# Patient Record
Sex: Female | Born: 1985 | Race: White | Hispanic: No | Smoking: Former smoker
Health system: Southern US, Community
[De-identification: ages and names within clinical notes are randomized; demographics above are authoritative.]

## PROBLEM LIST (undated history)

## (undated) DIAGNOSIS — F603 Borderline personality disorder: Secondary | ICD-10-CM

## (undated) DIAGNOSIS — F319 Bipolar disorder, unspecified: Secondary | ICD-10-CM

## (undated) DIAGNOSIS — F419 Anxiety disorder, unspecified: Secondary | ICD-10-CM

## (undated) DIAGNOSIS — J45909 Unspecified asthma, uncomplicated: Secondary | ICD-10-CM

## (undated) HISTORY — DX: Unspecified asthma, uncomplicated: J45.909

## (undated) HISTORY — DX: Anxiety disorder, unspecified: F41.9

## (undated) HISTORY — DX: Bipolar disorder, unspecified: F31.9

## (undated) HISTORY — DX: Borderline personality disorder: F60.3

## (undated) HISTORY — PX: NO PAST SURGERIES: SHX2092

---

## 2009-09-13 ENCOUNTER — Emergency Department: Payer: Self-pay | Admitting: Emergency Medicine

## 2009-09-14 ENCOUNTER — Emergency Department: Payer: Self-pay | Admitting: Emergency Medicine

## 2009-09-22 ENCOUNTER — Inpatient Hospital Stay: Payer: Self-pay | Admitting: Psychiatry

## 2011-02-10 DIAGNOSIS — F4001 Agoraphobia with panic disorder: Secondary | ICD-10-CM | POA: Insufficient documentation

## 2013-06-26 DIAGNOSIS — J45909 Unspecified asthma, uncomplicated: Secondary | ICD-10-CM | POA: Insufficient documentation

## 2013-06-28 DIAGNOSIS — O26899 Other specified pregnancy related conditions, unspecified trimester: Secondary | ICD-10-CM

## 2013-06-28 DIAGNOSIS — Z6791 Unspecified blood type, Rh negative: Secondary | ICD-10-CM | POA: Insufficient documentation

## 2018-02-21 ENCOUNTER — Encounter: Payer: Self-pay | Admitting: Physician Assistant

## 2018-02-21 ENCOUNTER — Ambulatory Visit (INDEPENDENT_AMBULATORY_CARE_PROVIDER_SITE_OTHER): Payer: BLUE CROSS/BLUE SHIELD | Admitting: Physician Assistant

## 2018-02-21 ENCOUNTER — Ambulatory Visit (INDEPENDENT_AMBULATORY_CARE_PROVIDER_SITE_OTHER): Payer: BLUE CROSS/BLUE SHIELD

## 2018-02-21 ENCOUNTER — Ambulatory Visit: Payer: Self-pay | Admitting: Physician Assistant

## 2018-02-21 VITALS — BP 102/68 | HR 60 | Resp 14 | Ht 65.0 in | Wt 204.0 lb

## 2018-02-21 DIAGNOSIS — J4520 Mild intermittent asthma, uncomplicated: Secondary | ICD-10-CM

## 2018-02-21 DIAGNOSIS — Z7689 Persons encountering health services in other specified circumstances: Secondary | ICD-10-CM | POA: Diagnosis not present

## 2018-02-21 DIAGNOSIS — M79672 Pain in left foot: Secondary | ICD-10-CM | POA: Diagnosis not present

## 2018-02-21 DIAGNOSIS — M779 Enthesopathy, unspecified: Secondary | ICD-10-CM | POA: Diagnosis not present

## 2018-02-21 DIAGNOSIS — M778 Other enthesopathies, not elsewhere classified: Secondary | ICD-10-CM

## 2018-02-21 DIAGNOSIS — F603 Borderline personality disorder: Secondary | ICD-10-CM | POA: Insufficient documentation

## 2018-02-21 DIAGNOSIS — F319 Bipolar disorder, unspecified: Secondary | ICD-10-CM | POA: Insufficient documentation

## 2018-02-21 MED ORDER — ALBUTEROL SULFATE 108 (90 BASE) MCG/ACT IN AEPB: 1.0000 | INHALATION_SPRAY | RESPIRATORY_TRACT | 2 refills | Status: AC | PRN

## 2018-02-21 NOTE — Patient Instructions (Signed)
Foot and ankle tendonitis is a common cause of foot pain. It occurs when there is inflammation or irritation of the tendons, which is usually due to overuse  from repetitive movements or stretching or an injury such as an ankle sprain.  Extensor tendonitis causes pain across the top of the foot. This form of foot tendonitis is caused by inflammation or irritation of the tendons that pull the toes up, usually from repeated friction or compression from a poorly-fitting shoe.  Let's start by looking at how tendons work. Tendons are tough, fibrous tissues that connect muscles to bones.  The extensor tendons join the muscles on the front of the lower leg to the toes. They pull the toes upwards away from the ground.  The two main extensor tendons come from extensor hallucis longus, which lifts up the big toe, and extensor digitorum, which lifts the other four toes.  Both tendons run down across the front of the ankle, across the top of the foot and then fan out attaching to the tips of the toes.  The tendons sit between the skin and the bones and there is little padding around them, making them prone to injury resulting in pain in top of foot.  Common Causes Extensor tendonitis most commonly occurs due to the foot rubbing against a shoe. It tends to affect people who spend long periods on their feet, people walking or running on uneven surfaces or up and down hills and people who lace their shoes too tightly.  Calf tightness can also contribute to the condition as can altered foot biomechanics.  People with high foot arches are more likely to have pressure on the top of their foot, and people with flat feet find their extensor tendons under more strain, both of which increases the chance of developing tendonitis.  Extensor Tendonitis can also occur after an injury such as if you have dropped something heavy onto the top of your foot or kicking something.  Symptoms of Extensor Tendonitis The symptoms tend  to be confined to the top of the foot, occasionally spreading to the arch of the foot:  1)  Top of Foot Pain: Tends to be worse with activity and better with rest.  The top of the foot maybe tender to touch making it uncomfortable wearing shoes  2)  Swelling and/or Bruising: There may be some visible swelling and/or bruising across the top of the foot  A simple test for extensor tendonitis is to try and draw your toes up towards you while resisting the movement with your hand. If that recreates your pain on top of foot, you probably have the condition.  Treatment Options Treatment aims to reduce irritation, inflammation and pain in top of foot. There are a number of things that can help:  1)  Rest: It is really important to avoid aggravating activities for ample time to allow the tendon to heal.  If it hurts, stop!  2)  Ice: Using ice regularly helps reduce the pain and inflammation.  Visit the Ice Treatment section to find out how to safely and effectively use ice.  3)  Medication: Non-steroidal anti-inflammatories such as ibuprofen and naproxen are often used to reduce pain and inflammation  4)  Shoe Laces: The simplest way to treat and prevent extensor tendonitis is to change how you lace your shoes.  Either tie your knot at the side or miss out one of the lacing holes over the most painful area.  It sounds simple, but it  really does make a big difference.  5)  Exercises: Strengthening exercises for the extensor muscles help to improve strength and endurance.  Calf stretches can also help as having tight calves puts more strain on the extensor tendons  6)  Orthotics: Shoe insoles and inserts can be used to provide padding and support the foot, taking any undue tension off the tendons  7)  Physical Therapy: Ultrasound therapy can help promote healing  8)  Steroid Injections: If the pain fails to settle, a steroid injection can be given to help reduce the inflammation.  Care must be taken  as it does temporarily weaken the tendon  The general principle for treating foot and ankle tendonitis is to give the injury rest so the body can heal it. This takes time, usually weeks to months. Your doctor may give you a walking boot to keep your foot and ankle immobilized so you aren't using it, or you may be directed to have no weight bearing on the affected foot.

## 2018-02-21 NOTE — Progress Notes (Signed)
HPI:                                                                Laura Eaton is a 33 y.o. female who presents to [facility]: Primary Care Sports Medicine today to establish care  Current concerns: left foot pain  Reports walking at work one day and felt a "pop" on the top of her left foot followed by a sharp, stabbing pain approx 1 month ago.  Reports foot is intermittently swollen. Seems worse at the end of the day after wearing tennis shoes. Better on the weekends when she does not walk around as much. Pain is worse with dorsiflexion. Has tried ice as needed. Has not taken any anti-inflammatories. No prior injury or trauma.  Bipolar disorder: last hospitalization was 2013. Reports during this time her ex-husband had left her, which triggered this episode. Does not currently take any medications for mood or anxiety. She uses CBD oil daily.  Asthma: mild, intermittent. Uses albuterol inhaler when she has a cold/respiratory illness. No exacerbations in the last year. No nocturnal cough. Requesting refill  Depression screen [facility] 2/9 02/21/2018  Decreased Interest 1  Down, Depressed, Hopeless 1  PHQ - 2 Score 2  Altered sleeping 0  Tired, decreased energy 3  Change in appetite 3  Feeling bad or failure about yourself  1  Trouble concentrating 0  Moving slowly or fidgety/restless 0  Suicidal thoughts 0  PHQ-9 Score 9    GAD 7 : Generalized Anxiety Score 02/21/2018  Nervous, Anxious, on Edge 0  Control/stop worrying 0  Worry too much - different things 1  Trouble relaxing 1  Restless 0  Easily annoyed or irritable 1  Afraid - awful might happen 1  Total GAD 7 Score 4      Past Medical History:  Diagnosis Date  . Anxiety   . Asthma   . Bipolar disorder (HCC)   . Borderline personality disorder [facility])    Past Surgical History:  Procedure Laterality Date  . NO PAST SURGERIES     Social History   Tobacco Use  . Smoking status: Former Smoker     Types: Cigarettes    Last attempt to quit: 03/15/2013    Years since quitting: 4.9  . Smokeless tobacco: Never Used  Substance Use Topics  . Alcohol use: Not Currently   family history includes Heart attack in her maternal grandfather and paternal grandmother; Hypertension in her paternal grandmother; Prostate cancer in her paternal grandfather.    ROS: negative except as noted in the HPI  Medications: Current Outpatient Medications  Medication Sig Dispense Refill  . Albuterol Sulfate (PROAIR RESPICLICK) 108 (90 Base) MCG/ACT AEPB Inhale 1-2 puffs into the lungs every 4 (four) hours as needed. 1 each 2   No current facility-administered medications for this visit.    No Known Allergies     Objective:  BP 102/68   Pulse 60   Resp 14   Ht  (1.651 m)   Wt 204 lb (92.5 kg)   LMP 02/14/2018 (Exact Date)   SpO2 98%   BMI 33.95 kg/m  Gen:  alert, not ill-appearing, no distress, appropriate for age, obese female HEENT: head normocephalic without obvious abnormality, conjunctiva and cornea clear,  trachea midline Pulm: Normal work of breathing, normal phonation, clear to auscultation bilaterally, no wheezes, rales or rhonchi CV: Normal rate, regular rhythm, s1 and s2 distinct, no murmurs, clicks or rubs  Neuro: alert and oriented x 3, no tremor MSK: extremities atraumatic, normal gait and station Left foot: atraumatic, distal pulses intact, tenderness of the proximal midfoot overlying the transverse tarsal joint and navicular Skin: intact, no rashes on exposed skin, no jaundice, no cyanosis Psych: well-groomed, cooperative, good eye contact, euthymic mood, affect mood-congruent, speech is articulate, and thought processes clear and goal-directed    No results found for this or any previous visit (from the past 72 hour(s)). No results found.    Assessment and Plan: 33 y.o. female with  .[name] was seen today for establish care.  Diagnoses and all orders for this  visit:  Encounter to establish care  Acute pain of left foot -     DG Foot Complete Left  Mild intermittent asthma without complication -     Albuterol Sulfate (PROAIR RESPICLICK) 108 (90 Base) MCG/ACT AEPB; Inhale 1-2 puffs into the lungs every 4 (four) hours as needed.  Extensor tendonitis of foot   - Personally reviewed PMH, PSH, PFH, medications, allergies, HM - Age-appropriate cancer screening: overdue for Pap smear, encouraged to schedule in 1 month - Influenza declined  - Tdap UTD - PHQ2=2  Acute pain of left foot - suspect extensor tendonitis - x-ray pending - home rehab exercises provided, perform daily - follow-up in 1 month  Bipolar disorder - PHQ9=9, no acute safety issues - GAD7=4 - declines treatment  Mild intermittent asthma - pulse ox 98% on RA at rest, no adventitious lung sounds - cont Albuterol 1-2 puffs Q4H prn  Patient education and anticipatory guidance given Patient agrees with treatment plan Follow-up in 1 month as needed if symptoms worsen or fail to improve  Levonne Hubertharley E. Lori-Ann Lindfors PA-C

## 2018-03-21 ENCOUNTER — Other Ambulatory Visit (HOSPITAL_COMMUNITY)
Admission: RE | Admit: 2018-03-21 | Discharge: 2018-03-21 | Disposition: A | Discharge status: Home / Self Care | Payer: BLUE CROSS/BLUE SHIELD | Source: Ambulatory Visit | Attending: Physician Assistant | Admitting: Physician Assistant

## 2018-03-21 ENCOUNTER — Ambulatory Visit (INDEPENDENT_AMBULATORY_CARE_PROVIDER_SITE_OTHER): Payer: BLUE CROSS/BLUE SHIELD | Admitting: Physician Assistant

## 2018-03-21 ENCOUNTER — Encounter: Payer: Self-pay | Admitting: Physician Assistant

## 2018-03-21 VITALS — BP 113/78 | HR 80 | Wt 203.0 lb

## 2018-03-21 DIAGNOSIS — E6609 Other obesity due to excess calories: Secondary | ICD-10-CM | POA: Diagnosis not present

## 2018-03-21 DIAGNOSIS — Z131 Encounter for screening for diabetes mellitus: Secondary | ICD-10-CM

## 2018-03-21 DIAGNOSIS — Z124 Encounter for screening for malignant neoplasm of cervix: Secondary | ICD-10-CM | POA: Insufficient documentation

## 2018-03-21 DIAGNOSIS — Z13220 Encounter for screening for lipoid disorders: Secondary | ICD-10-CM

## 2018-03-21 DIAGNOSIS — Z6833 Body mass index (BMI) 33.0-33.9, adult: Secondary | ICD-10-CM

## 2018-03-21 DIAGNOSIS — Z130 Encounter for screening for diseases of the blood and blood-forming organs and certain disorders involving the immune mechanism: Secondary | ICD-10-CM

## 2018-03-21 DIAGNOSIS — Z0000 Encounter for general adult medical examination without abnormal findings: Secondary | ICD-10-CM | POA: Diagnosis not present

## 2018-03-21 DIAGNOSIS — J00 Acute nasopharyngitis [common cold]: Secondary | ICD-10-CM

## 2018-03-21 NOTE — Patient Instructions (Addendum)
I have also ordered fasting labs. The lab is a walk-in open M-F 7:30a-4:30p (closed 12:30-1:30p). Nothing to eat or drink after midnight or at least 8 hours before your blood draw. You can have water and your medications.   For nasal symptoms/sinusitis: - nasal saline rinses / netti pot several times per day (do this prior to nasal spray) - Flonase or Nasonex nasal spray  - you can use OTC nasal decongestant sprays like Afrin (oxymetazoline) BUT do not use for more than 3 days as it will cause worsening congestion/nasal symptoms) - warm facial compresses - oral decongestants and antihistamines like Claritin-D and Zyrtec-D may help dry up secretions (caution using decongestants if you have high blood pressure, heart disease or kidney disease) - for sinus headache: Tylenol 1035m every 8 hours as needed. Alternate with Ibuprofen 6038mevery 6 hours     Preventive Care 18-39 Years, Female Preventive care refers to lifestyle choices and visits with your health care provider that can promote health and wellness. What does preventive care include?   A yearly physical exam. This is also called an annual well check.  Dental exams once or twice a year.  Routine eye exams. Ask your health care provider how often you should have your eyes checked.  Personal lifestyle choices, including: ? Daily care of your teeth and gums. ? Regular physical activity. ? Eating a healthy diet. ? Avoiding tobacco and drug use. ? Limiting alcohol use. ? Practicing safe sex. ? Taking vitamin and mineral supplements as recommended by your health care provider. What happens during an annual well check? The services and screenings done by your health care provider during your annual well check will depend on your age, overall health, lifestyle risk factors, and family history of disease. Counseling Your health care provider may ask you questions about your:  Alcohol use.  Tobacco use.  Drug use.  Emotional  well-being.  Home and relationship well-being.  Sexual activity.  Eating habits.  Work and work enStatistician Method of birth control.  Menstrual cycle.  Pregnancy history. Screening You may have the following tests or measurements:  Height, weight, and BMI.  Diabetes screening. This is done by checking your blood sugar (glucose) after you have not eaten for a while (fasting).  Blood pressure.  Lipid and cholesterol levels. These may be checked every 5 years starting at age 32 Skin check.  Hepatitis C blood test.  Hepatitis B blood test.  Sexually transmitted disease (STD) testing.  BRCA-related cancer screening. This may be done if you have a family history of breast, ovarian, tubal, or peritoneal cancers.  Pelvic exam and Pap test. This may be done every 3 years starting at age 4991Starting at age 1260this may be done every 5 years if you have a Pap test in combination with an HPV test. Discuss your test results, treatment options, and if necessary, the need for more tests with your health care provider. Vaccines Your health care provider may recommend certain vaccines, such as:  Influenza vaccine. This is recommended every year.  Tetanus, diphtheria, and acellular pertussis (Tdap, Td) vaccine. You may need a Td booster every 10 years.  Varicella vaccine. You may need this if you have not been vaccinated.  HPV vaccine. If you are 2682r younger, you may need three doses over 6 months.  Measles, mumps, and rubella (MMR) vaccine. You may need at least one dose of MMR. You may also need a second dose.  Pneumococcal 13-valent  conjugate (PCV13) vaccine. You may need this if you have certain conditions and were not previously vaccinated.  Pneumococcal polysaccharide (PPSV23) vaccine. You may need one or two doses if you smoke cigarettes or if you have certain conditions.  Meningococcal vaccine. One dose is recommended if you are age 69-21 years and a first-year  college student living in a residence hall, or if you have one of several medical conditions. You may also need additional booster doses.  Hepatitis A vaccine. You may need this if you have certain conditions or if you travel or work in places where you may be exposed to hepatitis A.  Hepatitis B vaccine. You may need this if you have certain conditions or if you travel or work in places where you may be exposed to hepatitis B.  Haemophilus influenzae type b (Hib) vaccine. You may need this if you have certain risk factors. Talk to your health care provider about which screenings and vaccines you need and how often you need them. This information is not intended to replace advice given to you by your health care provider. Make sure you discuss any questions you have with your health care provider. Document Released: 03/03/2001 Document Revised: 08/18/2016 Document Reviewed: 11/06/2014 Elsevier Interactive Patient Education  2019 [employer].

## 2018-03-21 NOTE — Progress Notes (Signed)
HPI:                                                                Laura Eaton is a 33 y.o. female who [facility] today for Pap smear only  Current Concerns include: none   GYN/Sexual Health  Obstetrics: G1P1   Menstrual status: having periods  LMP: 1 week ago  Menses: regular  Last pap smear: 06/26/2013, NILM  History of abnormal pap smears: never  Sexually active: not currently  Current contraception: abstinence  History of STI: never  [facility] 2/9 03/21/2018 02/21/2018  Decreased Interest 1 1  Down, Depressed, Hopeless 0 1  PHQ - 2 Score 1 2  Altered sleeping 1 0  Tired, decreased energy 2 3  Change in appetite 2 3  Feeling bad or failure about yourself  1 1  Trouble concentrating 1 0  Moving slowly or fidgety/restless 1 0  Suicidal thoughts 0 0  PHQ-9 Score 9 9    Health Maintenance Health Maintenance  Topic Date Due  . PAP SMEAR-Modifier  03/16/2007  . INFLUENZA VACCINE  04/19/2018 (Originally 08/19/2017)  . TETANUS/TDAP  05/20/2026  . HIV Screening  Discontinued    Past Medical History:  Diagnosis Date  . Anxiety   . Asthma   . Bipolar disorder (HCC)   . Borderline personality disorder [facility](HCC)    Past Surgical History:  Procedure Laterality Date  . NO PAST SURGERIES     Social History   Tobacco Use  . Smoking status: Former Smoker    Types: Cigarettes    Last attempt to quit: 03/15/2013    Years since quitting: 5.0  . Smokeless tobacco: Never Used  Substance Use Topics  . Alcohol use: Not Currently   family history includes Heart attack in her maternal grandfather and paternal grandmother; Hypertension in her paternal grandmother; Prostate cancer in her paternal grandfather.  Review of Systems  Constitutional: Negative.   HENT: Positive for congestion, rhinorrhea and sneezing. Negative for ear pain, facial swelling, sinus pressure, sinus pain and sore  throat.   Respiratory: Negative for shortness of breath and wheezing.        + asthma  Gastrointestinal:       + acid reflux  Genitourinary: Negative.   Allergic/Immunologic: Negative for environmental allergies.  Psychiatric/Behavioral: Positive for dysphoric mood.  All other systems reviewed and are negative.    Medications: Current Outpatient Medications  Medication Sig Dispense Refill  . Albuterol Sulfate (PROAIR RESPICLICK) 108 (90 Base) MCG/ACT AEPB Inhale 1-2 puffs into the lungs every 4 (four) hours as needed. 1 each 2   No current facility-administered medications for this visit.    No Known Allergies     Objective:  BP 113/78   Pulse 80   Wt 203 lb (92.1 kg)   LMP 03/14/2018   BMI 33.78 kg/m  General Appearance:  Alert, cooperative, no distress, appropriate for age, obese female                            Head:  Normocephalic, without obvious abnormality  Eyes:  PERRL, EOM's intact, conjunctiva and cornea clear                                                     Nose:  Nares symmetrical, mucosa pink                          Throat:  Lips, tongue, and mucosa are moist, pink, and intact; oropharynx clear, tonsils grade 1+ symmetric, uvula midline; good dentition                             Neck:  Supple; symmetrical, trachea midline, no adenopathy; thyroid: no enlargement, symmetric, no tenderness/mass/nodules                             Back:  Symmetrical, no curvature, ROM normal               Chest/Breast:  No tenderness, masses or nipple abnormality. No dimpling or discharge                           Lungs:  Clear to auscultation bilaterally, respirations unlabored                             Heart:  normal rate & regular rhythm, S1 and S2 normal, no murmurs, rubs, or gallops                     Abdomen:  Soft, non-tender, no mass or organomegaly, exam limited due to body habitus              Genitourinary:   vulva without rashes or  lesions, normal introitus and urethral meatus, vaginal mucosa without erythema, moderate amount of white discharge         Musculoskeletal:  Tone and strength strong and symmetrical, all extremities; no joint pain or edema, normal gait and station,                                        Lymphatic:  No adenopathy             Skin/Hair/Nails:  Skin warm, dry and intact, no rashes or abnormal dyspigmentation on limited exam                   Neurologic:  Alert and oriented x3, no cranial nerve deficits, sensation grossly intact, normal gait and station, no tremor Psych: well-groomed, cooperative, good eye contact, euthymic mood, affect mood-congruent, speech is articulate, and thought processes clear and goal-directed  A chaperone was present for the GU and breast portion of the exam, Olivia Mackie, RMA.  Wt Readings from Last 3 Encounters:  03/21/18 203 lb (92.1 kg)  02/21/18 204 lb (92.5 kg)     No results found for this or any previous visit (from the past 72 hour(s)). No results found.    Assessment and Plan: 33 y.o. female with  .[name] was seen today for annual exam and gynecologic exam.  Diagnoses and all orders  for this visit:  Encounter for annual physical exam -     CBC -     COMPLETE METABOLIC PANEL WITH GFR -     Lipid Panel w/reflex Direct LDL  Encounter for Pap smear of cervix with HPV DNA cotesting -     Cytology - PAP  Class 1 obesity due to excess calories without serious comorbidity with body mass index (BMI) of 33.0 to 33.9 in adult  Screening for diabetes mellitus -     COMPLETE METABOLIC PANEL WITH GFR  Screening for lipid disorders -     Lipid Panel w/reflex Direct LDL  Screening for blood disease -     CBC   - Personally reviewed PMH, PSH, PFH, medications, allergies, HM - Age-appropriate cancer screening: Pap smear w/HPV co-test pending - Declined STI screening - Influenza declined - Tdap UTD - PHQ2=1, negative - BMI 33 - counseled on weight  loss through decreased caloric intake and increased aerobic exercise - Routine fasting labs pending  Acute rhinitis Counseled on supportive care No signs or symptoms of asthma exacerbation  Patient education and anticipatory guidance given Patient agrees with treatment plan Follow-up based on Pap results or sooner as needed  Levonne Hubert PA-C

## 2018-03-23 LAB — CYTOLOGY - PAP
Diagnosis: NEGATIVE
HPV: NOT DETECTED

## 2019-03-21 ENCOUNTER — Encounter: Payer: BLUE CROSS/BLUE SHIELD | Admitting: Physician Assistant

## 2020-01-07 IMAGING — DX DG FOOT COMPLETE 3+V*L*
3 series · 3 of 3 positions shown · non-contrast
Comparison: None.

CLINICAL DATA: Pain for approximately 1 month

EXAM:
LEFT FOOT - COMPLETE 3+ VIEW

[foot ap]
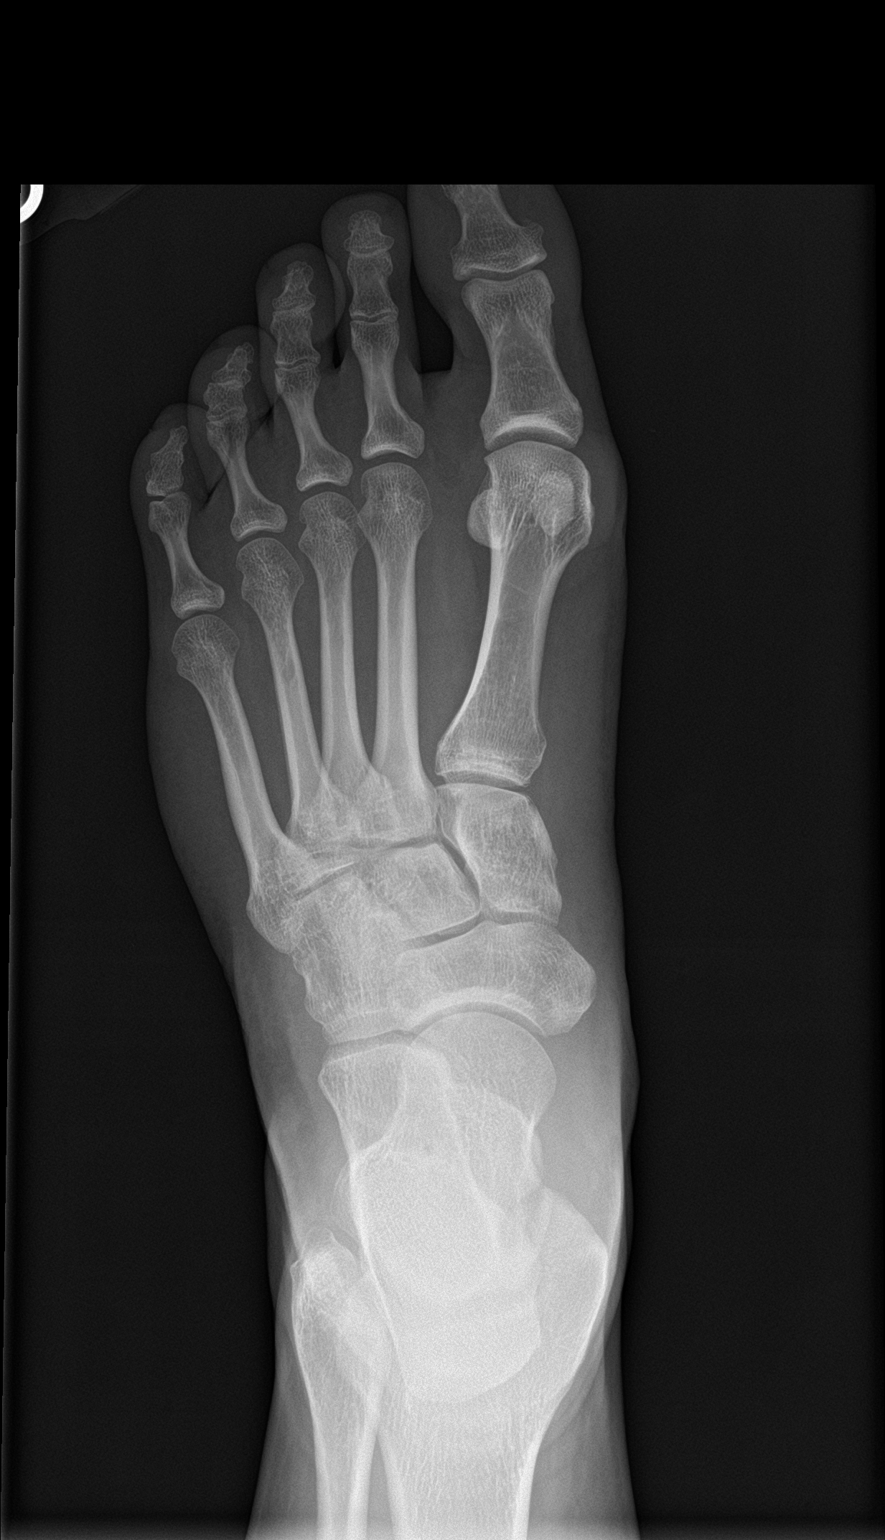

[foot obl]
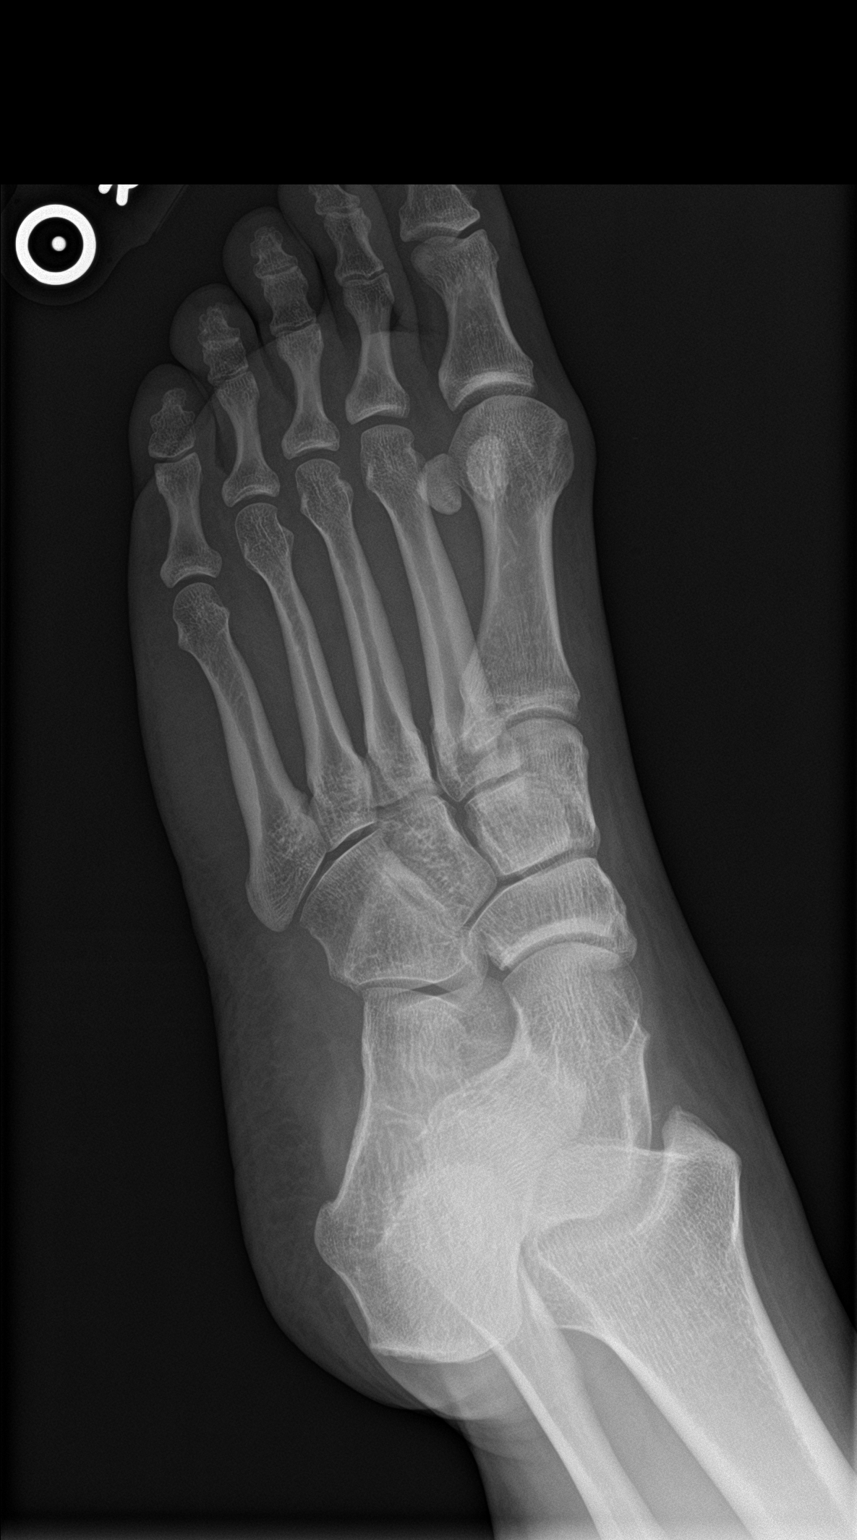

[foot lat]
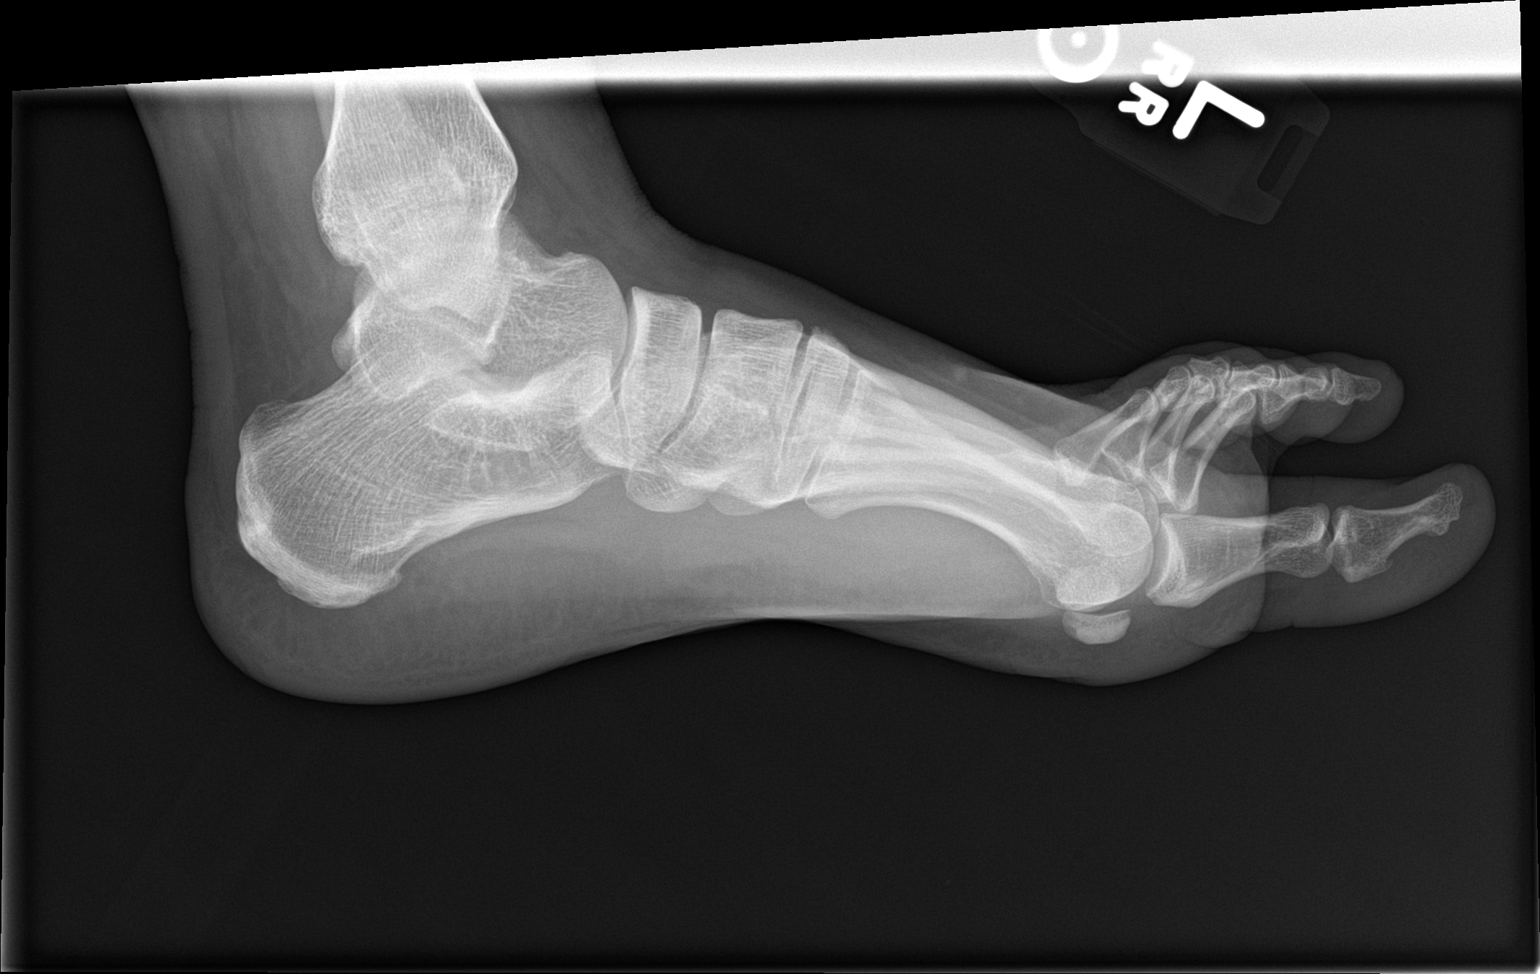

[3 of 3 positions shown; findings below may reference images not displayed]

FINDINGS: Frontal, oblique, and lateral views were obtained. There is no
fracture or dislocation. Joint spaces appear normal. No erosive
change.
IMPRESSION: No fracture or dislocation.  No appreciable arthropathy.
